# Patient Record
Sex: Female | Born: 1995 | Race: Black or African American | Hispanic: No | Marital: Single | Smoking: Never smoker
Health system: Southern US, Community
[De-identification: ages and names within clinical notes are randomized; demographics above are authoritative.]

## PROBLEM LIST (undated history)

## (undated) DIAGNOSIS — J45909 Unspecified asthma, uncomplicated: Secondary | ICD-10-CM

## (undated) HISTORY — PX: STOMACH SURGERY: SHX791

---
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
## 2017-05-24 IMAGING — CR DG CHEST 2V
2 series · 2 of 2 positions shown · non-contrast
Comparison: None.

CLINICAL DATA: Cough for 2 days. Sore throat. Bilateral ear pain
and runny nose. History of asthma.

EXAM:
CHEST  2 VIEW

[chest pa]
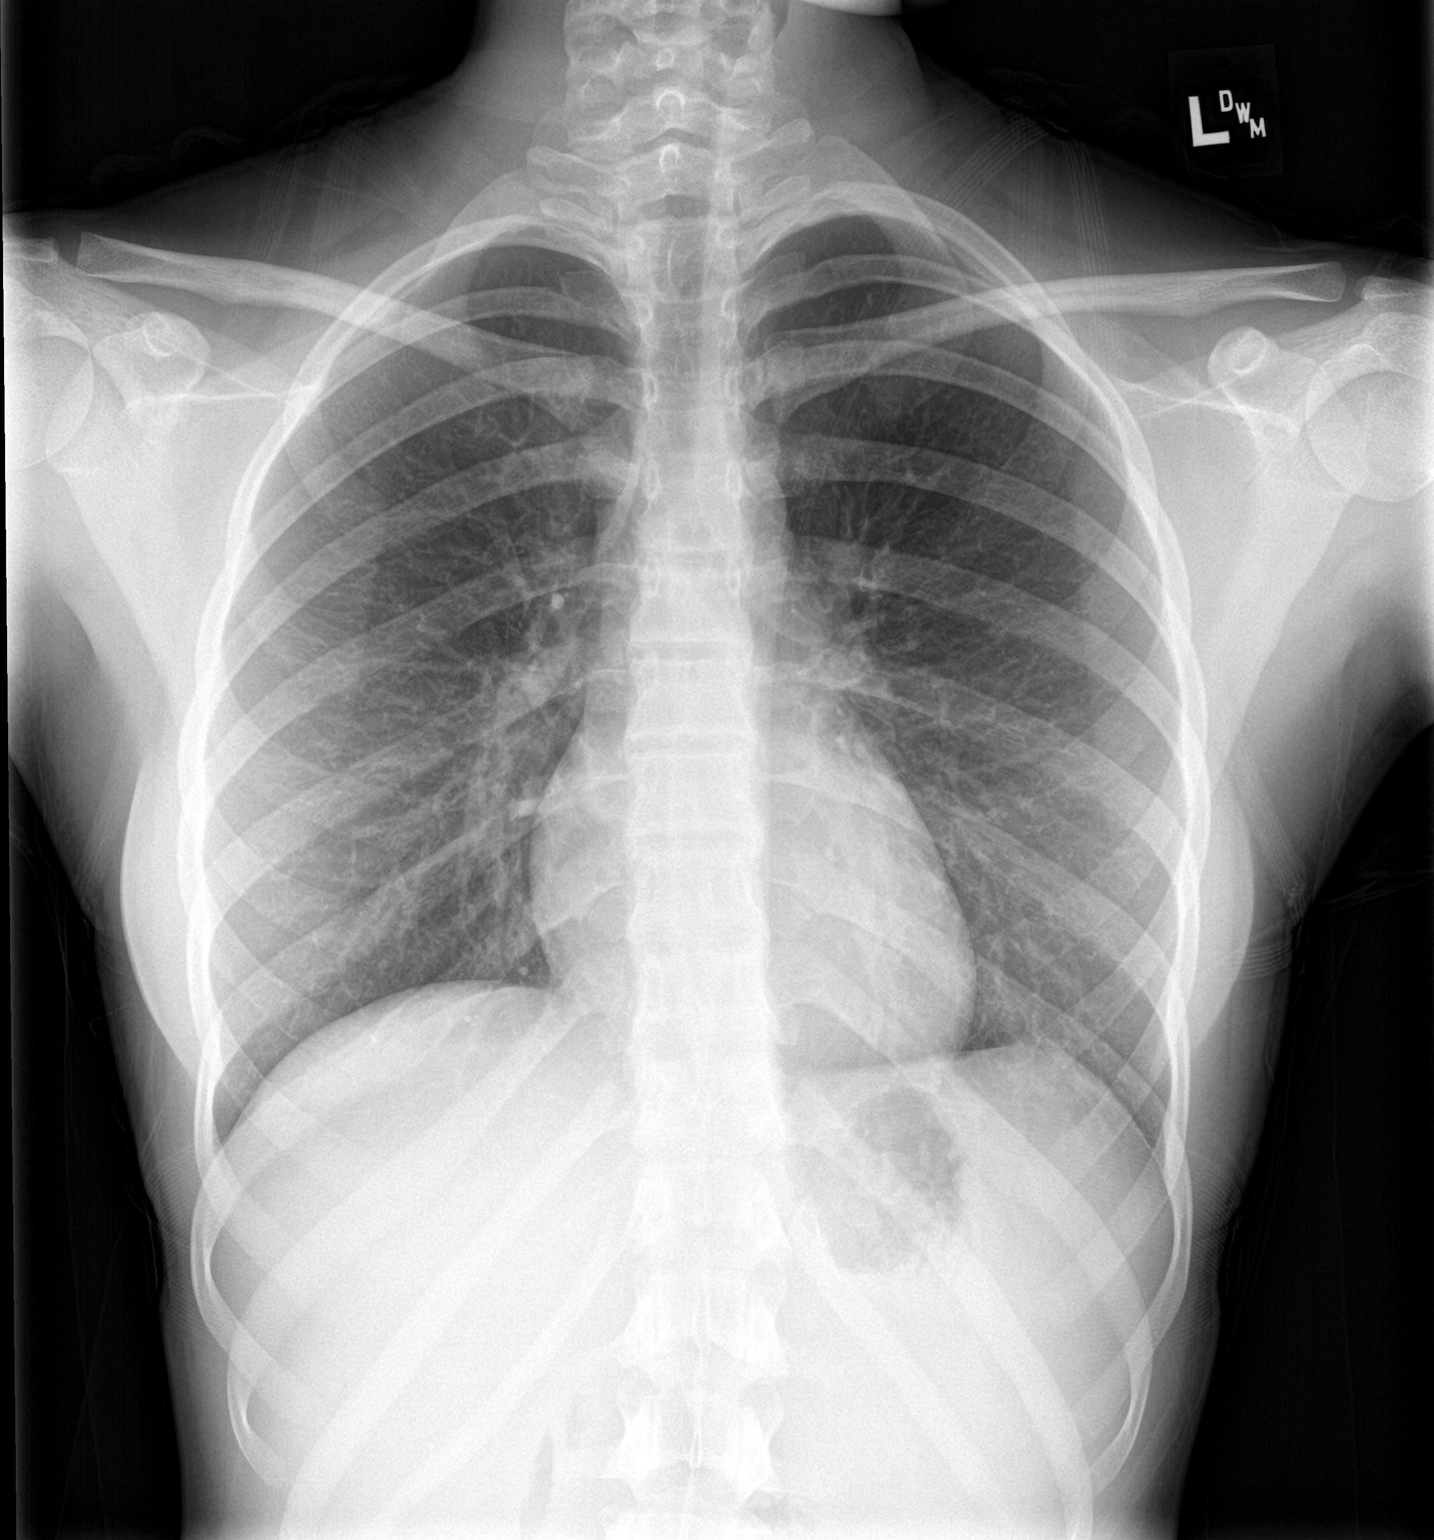

[chest lat]
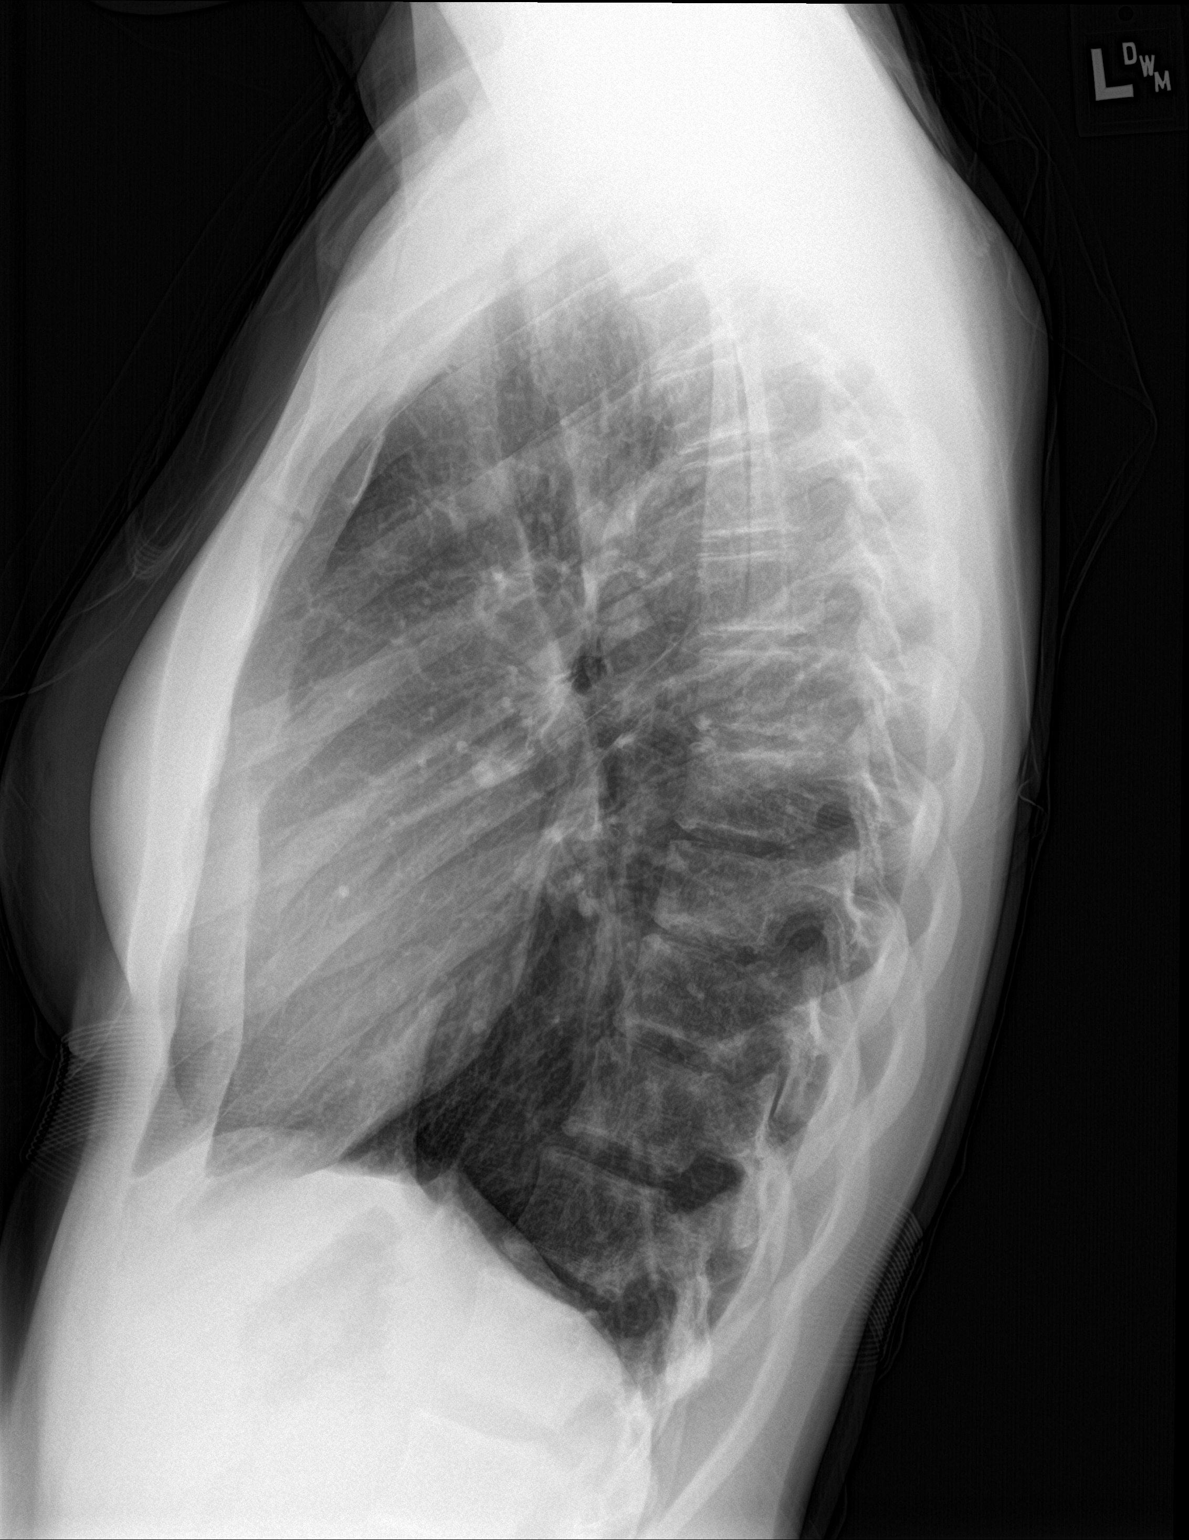

[2 of 2 positions shown; findings below may reference images not displayed]

FINDINGS: Mild hyperinflation compatible with history of asthma. The heart
size and mediastinal contours are within normal limits. Both lungs
are clear. The visualized skeletal structures are unremarkable.
IMPRESSION: No active cardiopulmonary disease.
